# Patient Record
Sex: Female | Born: 1970 | Race: White | Hispanic: Yes | Marital: Married | Smoking: Never smoker
Health system: Southern US, Community
[De-identification: ages and names within clinical notes are randomized; demographics above are authoritative.]

## PROBLEM LIST (undated history)

## (undated) DIAGNOSIS — N760 Acute vaginitis: Secondary | ICD-10-CM

## (undated) DIAGNOSIS — B9689 Other specified bacterial agents as the cause of diseases classified elsewhere: Secondary | ICD-10-CM

## (undated) DIAGNOSIS — E079 Disorder of thyroid, unspecified: Secondary | ICD-10-CM

---

## 2006-02-17 ENCOUNTER — Ambulatory Visit (HOSPITAL_COMMUNITY): Admission: RE | Admit: 2006-02-17 | Discharge: 2006-02-17 | Payer: Self-pay | Admitting: Family Medicine

## 2009-02-20 ENCOUNTER — Ambulatory Visit (HOSPITAL_COMMUNITY): Admission: RE | Admit: 2009-02-20 | Discharge: 2009-02-20 | Payer: Self-pay | Admitting: Family Medicine

## 2009-02-26 ENCOUNTER — Encounter: Admission: RE | Admit: 2009-02-26 | Discharge: 2009-02-26 | Payer: Self-pay | Admitting: Family Medicine

## 2009-10-16 ENCOUNTER — Ambulatory Visit: Payer: Self-pay | Admitting: Obstetrics and Gynecology

## 2009-10-16 ENCOUNTER — Other Ambulatory Visit: Admission: RE | Admit: 2009-10-16 | Discharge: 2009-10-16 | Payer: Self-pay | Admitting: Obstetrics and Gynecology

## 2009-10-30 ENCOUNTER — Ambulatory Visit: Payer: Self-pay | Admitting: Obstetrics and Gynecology

## 2009-11-21 ENCOUNTER — Other Ambulatory Visit: Admission: RE | Admit: 2009-11-21 | Discharge: 2009-11-21 | Payer: Self-pay | Admitting: Obstetrics and Gynecology

## 2009-11-21 ENCOUNTER — Ambulatory Visit: Payer: Self-pay | Admitting: Obstetrics & Gynecology

## 2009-12-18 ENCOUNTER — Ambulatory Visit: Payer: Self-pay | Admitting: Obstetrics and Gynecology

## 2009-12-19 ENCOUNTER — Ambulatory Visit (HOSPITAL_COMMUNITY): Admission: RE | Admit: 2009-12-19 | Discharge: 2009-12-19 | Payer: Self-pay | Admitting: Obstetrics & Gynecology

## 2010-04-25 ENCOUNTER — Ambulatory Visit: Payer: Self-pay | Admitting: Obstetrics and Gynecology

## 2010-05-29 ENCOUNTER — Ambulatory Visit (INDEPENDENT_AMBULATORY_CARE_PROVIDER_SITE_OTHER): Payer: Medicaid Other | Admitting: Obstetrics and Gynecology

## 2010-05-29 ENCOUNTER — Other Ambulatory Visit: Payer: Self-pay | Admitting: Obstetrics and Gynecology

## 2010-05-29 ENCOUNTER — Other Ambulatory Visit: Payer: Self-pay | Admitting: Family Medicine

## 2010-05-29 DIAGNOSIS — D069 Carcinoma in situ of cervix, unspecified: Secondary | ICD-10-CM

## 2010-05-29 DIAGNOSIS — Z1231 Encounter for screening mammogram for malignant neoplasm of breast: Secondary | ICD-10-CM

## 2010-05-29 LAB — POCT PREGNANCY, URINE: Preg Test, Ur: NEGATIVE

## 2010-05-30 LAB — POCT PREGNANCY, URINE: Preg Test, Ur: NEGATIVE

## 2010-06-06 NOTE — Progress Notes (Unsigned)
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  That was done in September 2011, came today for repeat Pap smear.  Two weeks after her procedure last time, she flew to [place] on vacation and started bleeding very heavily, went into emergency room, they kept her overnight and had to use cautery in order to control the bleeding.  On examination today, the cervix appears completely well-healed and normal, looks almost like a clean nulliparous cervix and Pap smear was taken.  I have told her that with the findings that she had there if the Pap smears turn out to be okay, I would like to see her at least once every 6 months for the next 2 years before we go back to annuals.  I told her that she get a letter by the month or before and call if there is an abnormal Pap smear.  Impression is severe cervical dysplasia, CIN III with endocervical involvement and margins up to the endocervix.          ______________________________ Argentina Donovan, MD    PR/MEDQ  D:  05/29/2010  T:  05/30/2010  Job:  [id number]

## 2010-06-11 ENCOUNTER — Ambulatory Visit (HOSPITAL_COMMUNITY)
Admission: RE | Admit: 2010-06-11 | Discharge: 2010-06-11 | Disposition: A | Discharge status: Home / Self Care | Payer: Medicaid Other | Source: Ambulatory Visit | Attending: Family Medicine | Admitting: Family Medicine

## 2010-06-11 DIAGNOSIS — Z1231 Encounter for screening mammogram for malignant neoplasm of breast: Secondary | ICD-10-CM | POA: Insufficient documentation

## 2010-10-13 IMAGING — US US TRANSVAGINAL NON-OB
1 series · 13 of 25 positions shown · non-contrast
Comparison: None.

12/19/2009 - DUPLICATE COPY for exam association in [facility] – No change from original report.
CLINICAL DATA: Pelvic pain. Abnormal uterine bleeding. LMP
 11/30/2009



[Series 1: us transvaginal non-ob · 0.27mm/px · 13 of 52 slices shown]
[im 1/52]
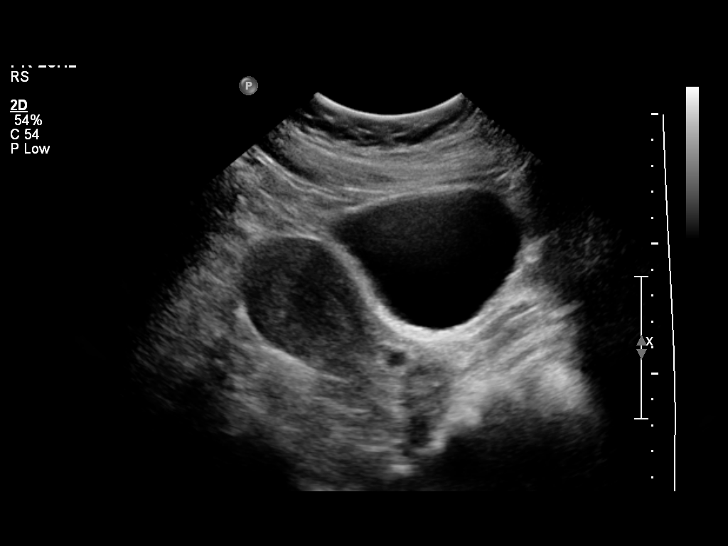
[im 5/52]
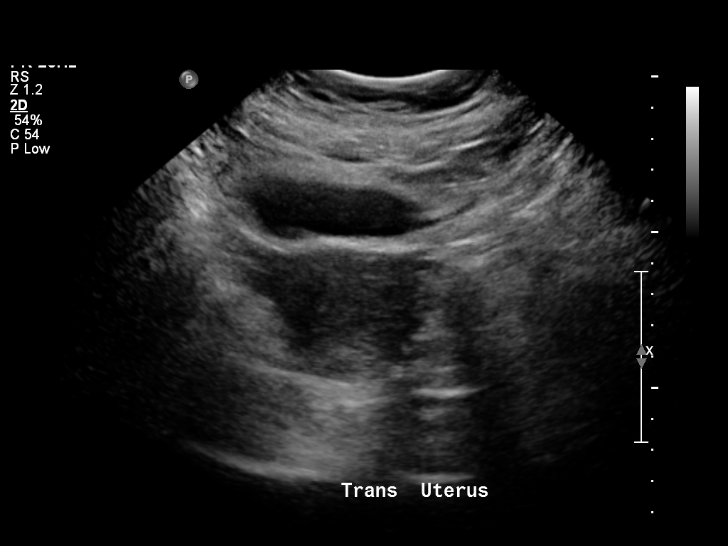
[im 9/52]
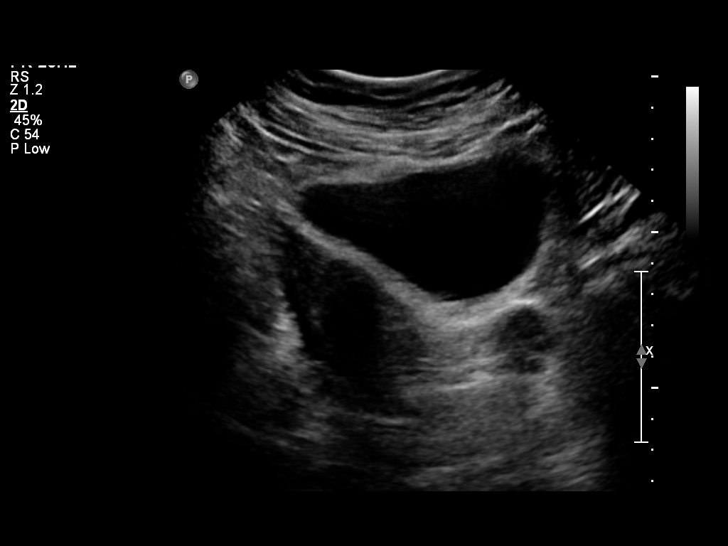
[im 13/52]
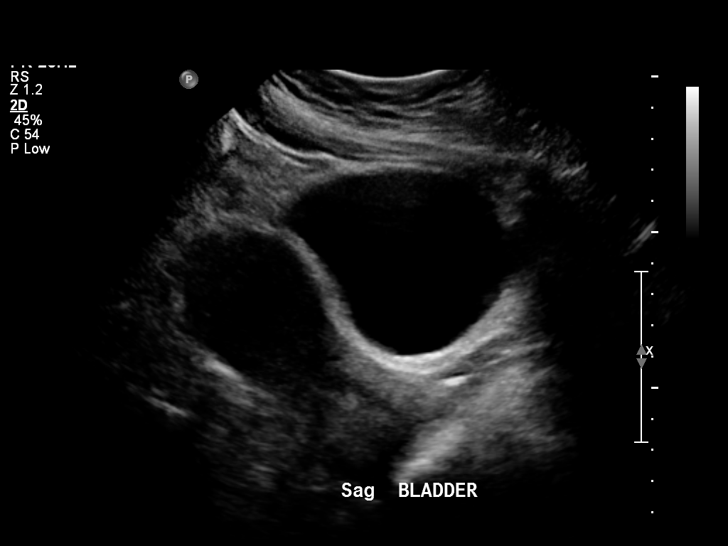
[im 18/52]
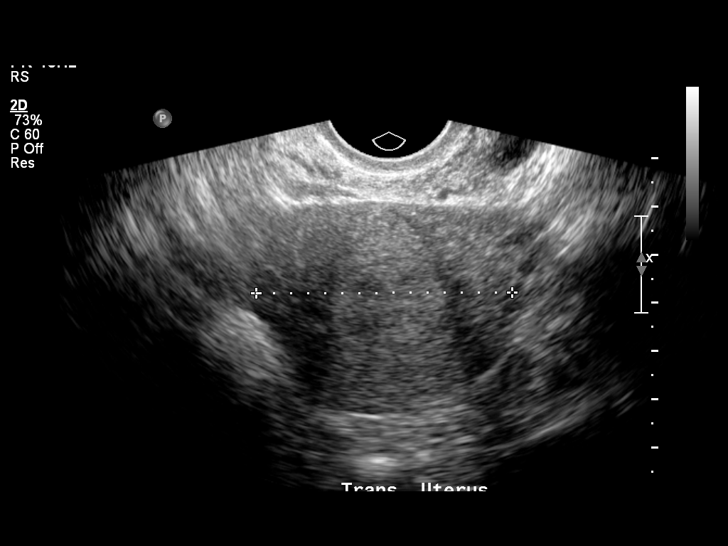
[im 22/52]
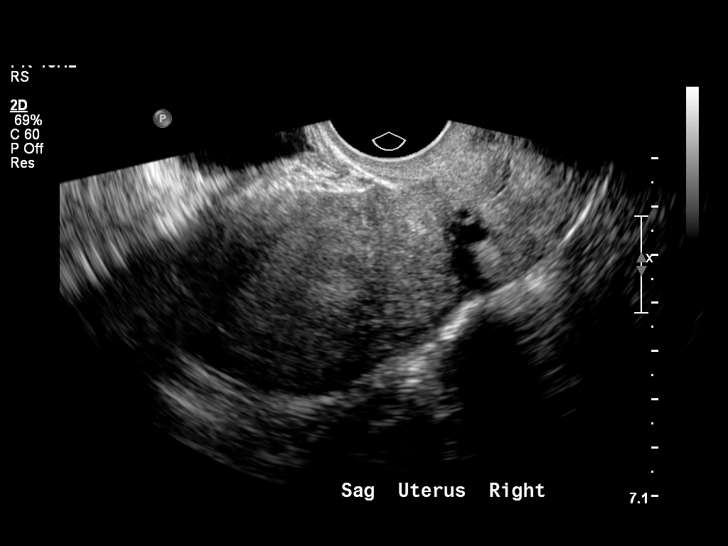
[im 26/52]
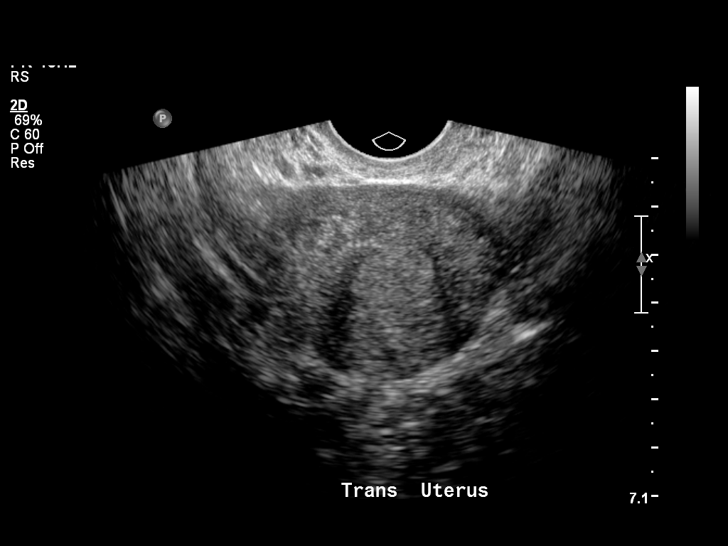
[im 30/52]
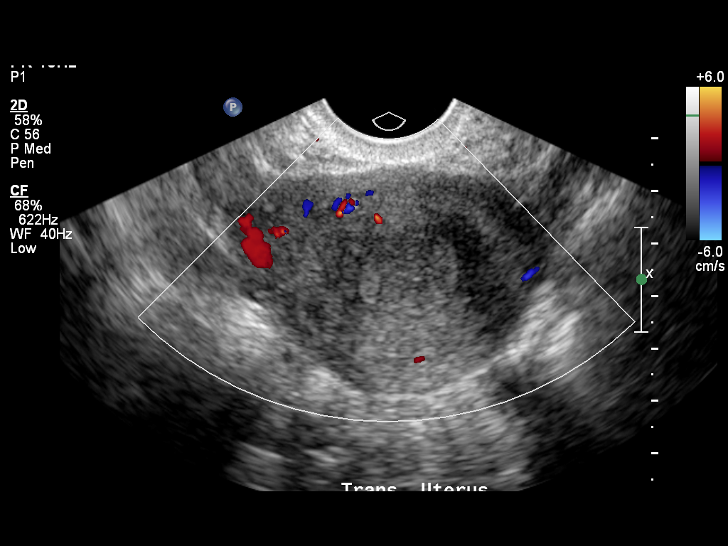
[im 35/52]
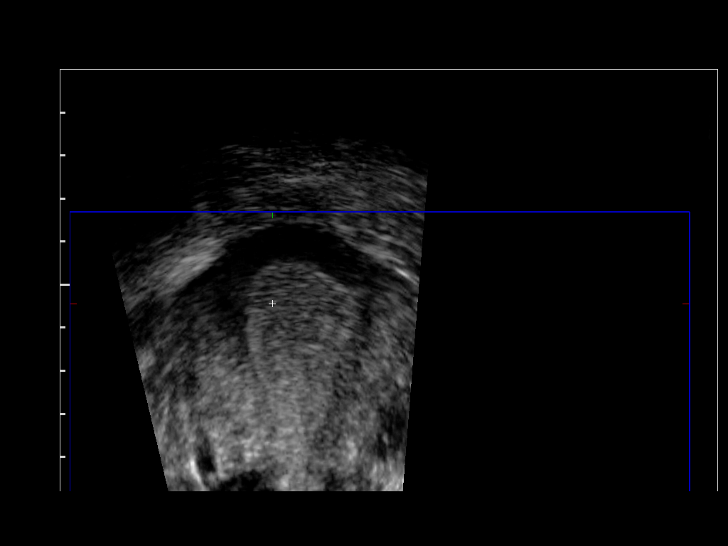
[im 39/52]
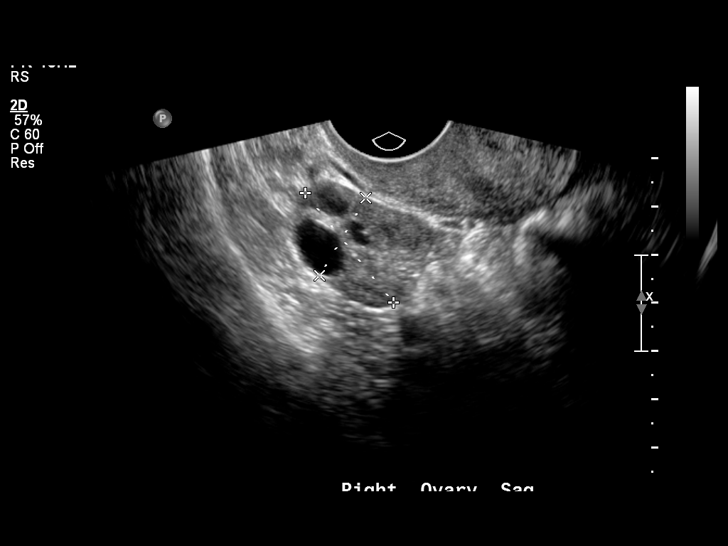
[im 43/52]
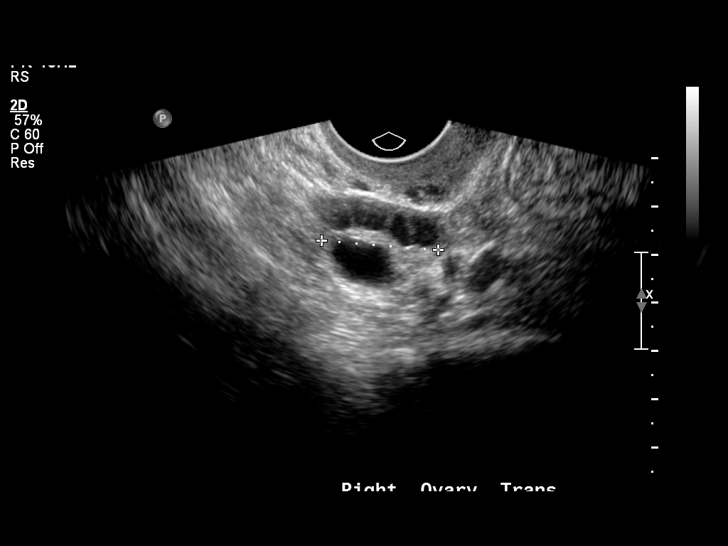
[im 47/52]
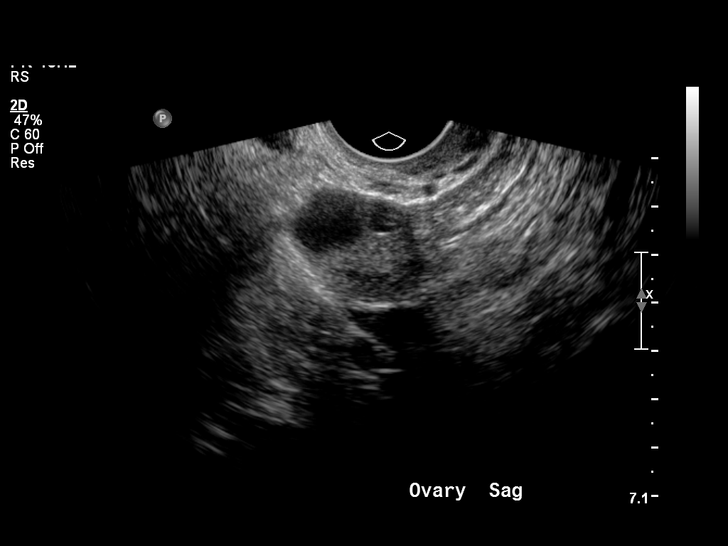
[im 52/52]
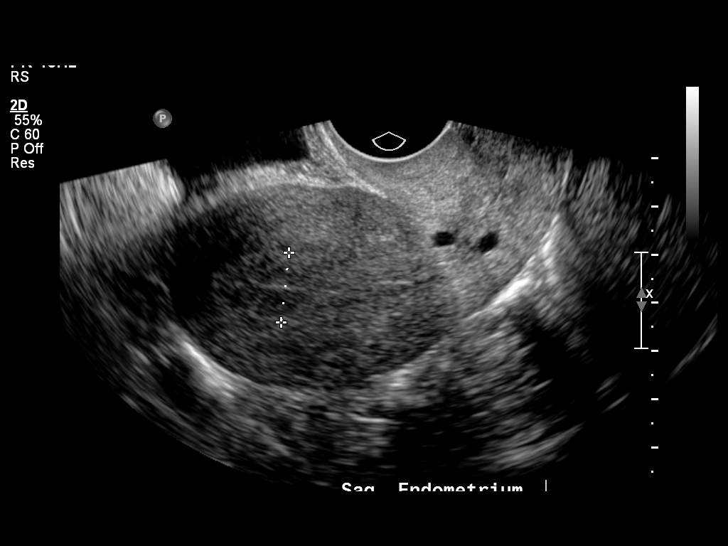

[13 of 25 positions shown; findings below may reference images not displayed]

FINDINGS: Uterus measures 8.8 x 4.2 x 5.3 cm. No fibroids or other uterine
 masses identified.

 Endometrium measures 14 mm in thickness. Within normal limits in
 appearance.

 Right Ovary measures 2.9 x 1.9 x 2.4 cm. Normal appearance.

 Left Ovary measures 2.8 x 1.9 x 2.3 cm. Normal appearance.

 Other Findings: No other abnormality identified.
IMPRESSION: Normal study. No evidence of pelvic mass or other significant
 abnormality.If abnormal uterine bleeding is unresponsive to
 hormonal or medical therapy, sonohysterogram could be performed for
 focal lesion work-up.

## 2011-04-05 IMAGING — MG MM DIGITAL SCREENING BILAT W/ CAD
4 series · 4 of 4 positions shown · non-contrast
Comparison: none

DG SCREEN MAMMOGRAM BILATERAL
Bilateral CC and MLO view(s) were taken.
Technologist: Dionisia Yt, RT, RM

DIGITAL SCREENING MAMMOGRAM WITH CAD:
The breast tissue is heterogeneously dense.  No masses or malignant type calcifications are 
identified.  Compared with prior studies.
Images were processed with CAD.

[R CC]
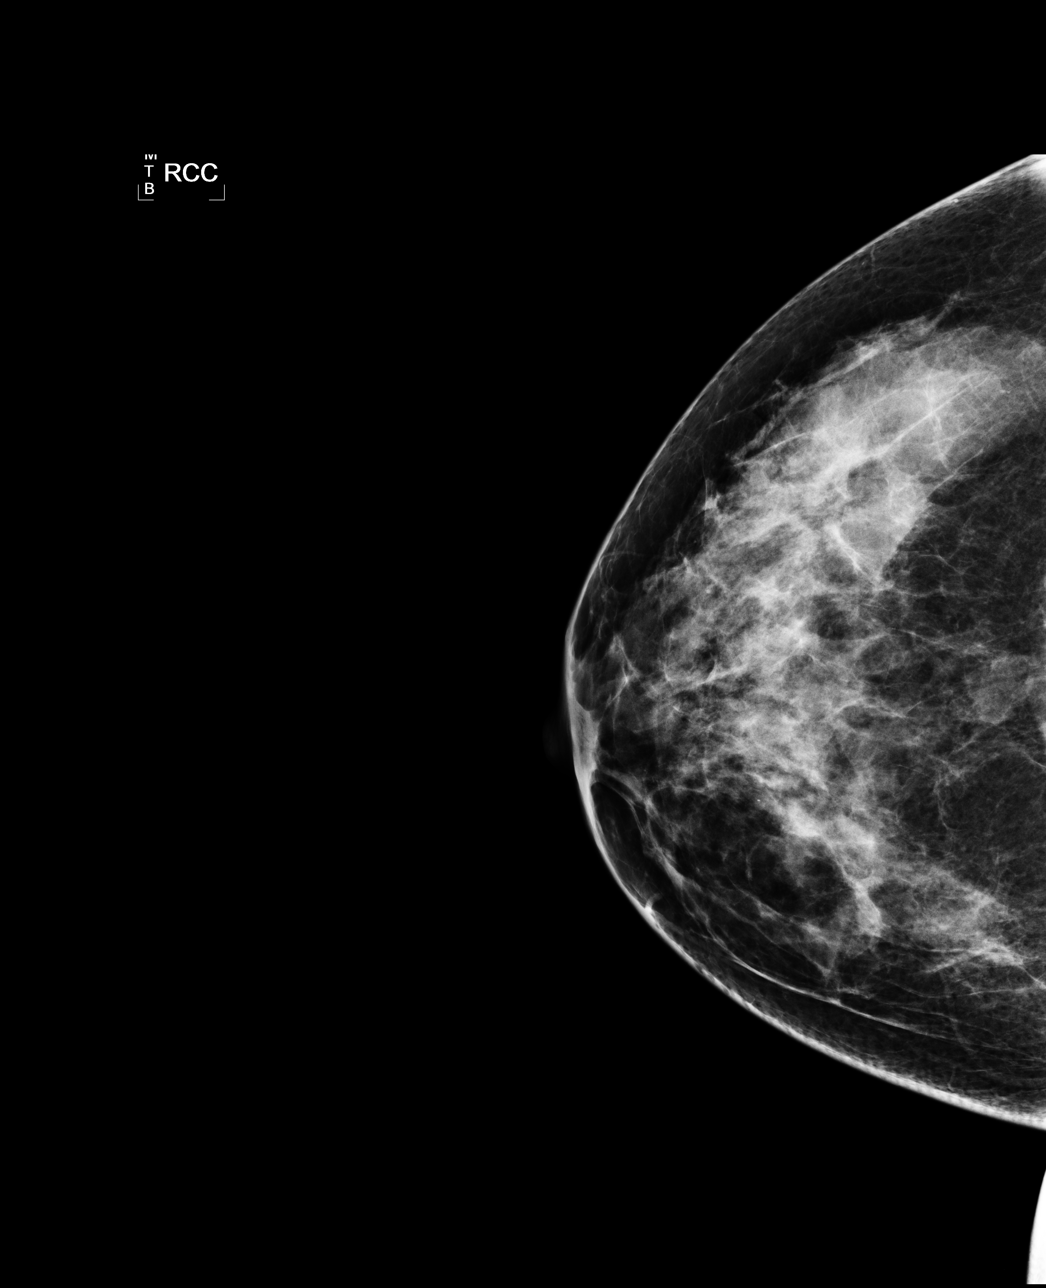

[R MLO]
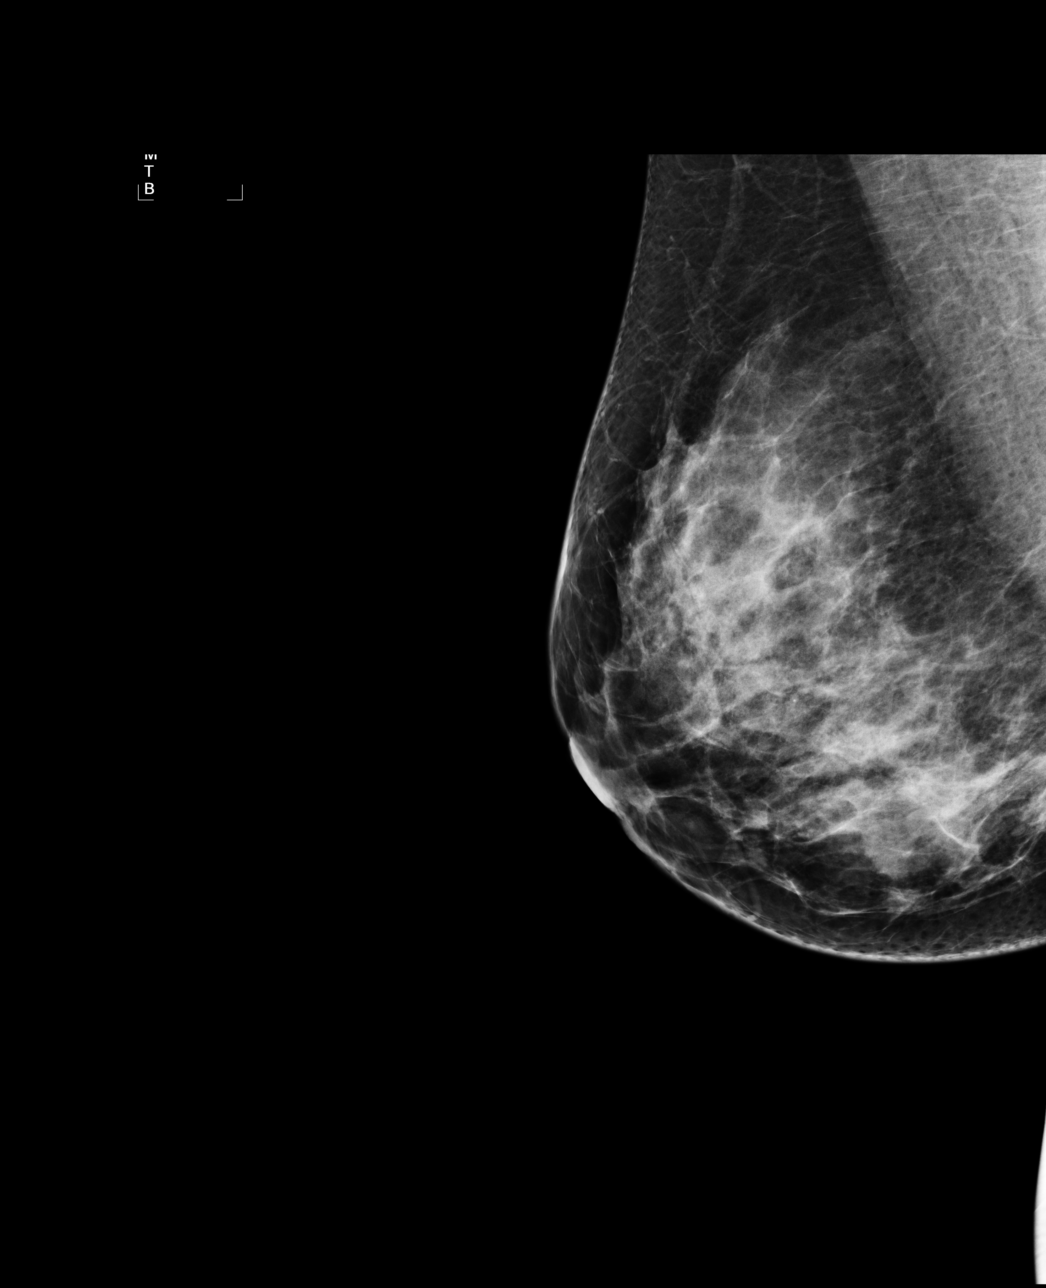

[L CC]
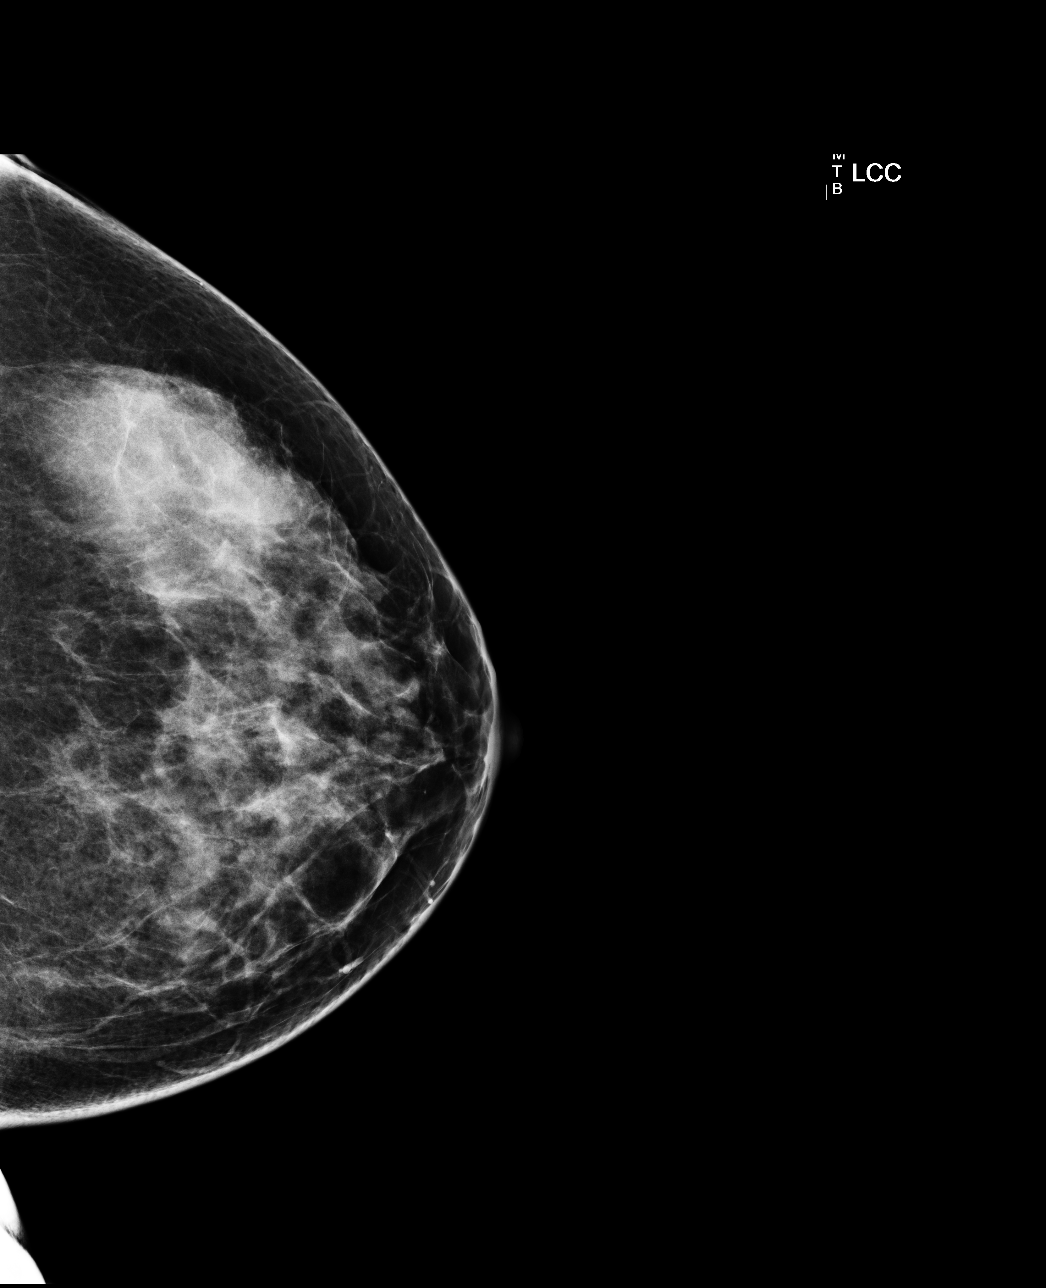

[L MLO]
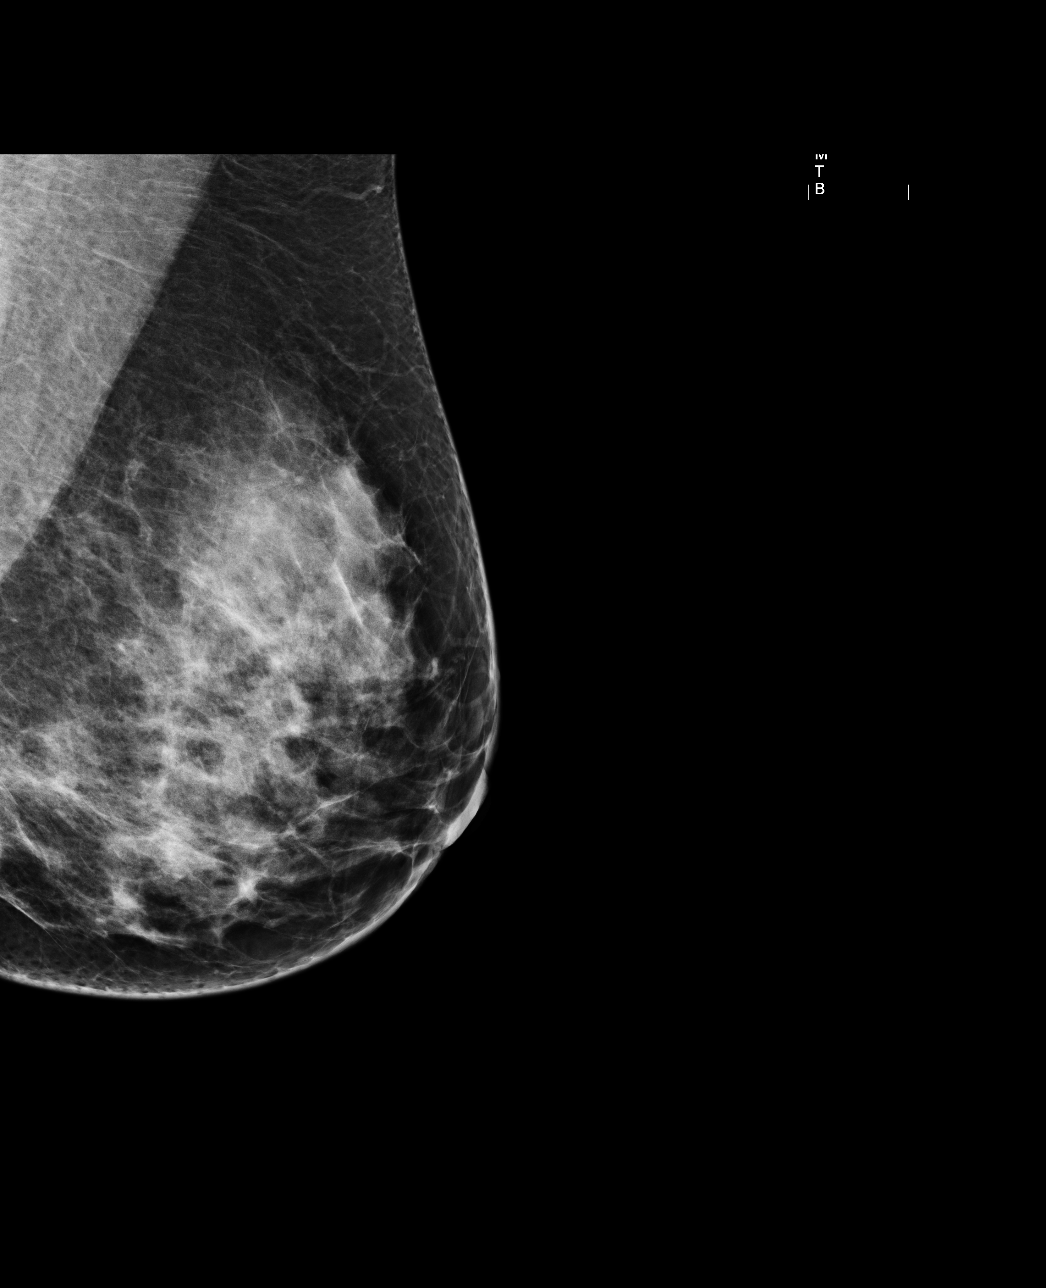

[4 of 4 positions shown; findings below may reference images not displayed]

IMPRESSION: No specific mammographic evidence of malignancy.  Next screening mammogram is recommended in one 
year.

A result letter of this screening mammogram will be mailed directly to the patient.

ASSESSMENT: Negative - BI-RADS 1

Screening mammogram in 1 year.
,

## 2013-03-30 ENCOUNTER — Other Ambulatory Visit: Payer: Self-pay | Admitting: Obstetrics and Gynecology

## 2013-03-30 DIAGNOSIS — Z1231 Encounter for screening mammogram for malignant neoplasm of breast: Secondary | ICD-10-CM

## 2013-05-01 ENCOUNTER — Telehealth (HOSPITAL_COMMUNITY): Payer: Self-pay | Admitting: *Deleted

## 2013-05-01 NOTE — Telephone Encounter (Signed)
Telephoned patient at home # and left message to return call to [facility] 

## 2013-05-02 ENCOUNTER — Ambulatory Visit (HOSPITAL_COMMUNITY): Payer: Medicaid Other

## 2013-05-09 ENCOUNTER — Ambulatory Visit (HOSPITAL_COMMUNITY): Payer: Medicaid Other | Attending: Obstetrics and Gynecology

## 2013-05-09 ENCOUNTER — Ambulatory Visit (HOSPITAL_COMMUNITY): Payer: Medicaid Other

## 2013-05-10 ENCOUNTER — Telehealth (HOSPITAL_COMMUNITY): Payer: Self-pay | Admitting: *Deleted

## 2013-05-10 NOTE — Telephone Encounter (Signed)
Telephoned patient at home # and left message to return call to [facility] 

## 2013-05-15 ENCOUNTER — Other Ambulatory Visit: Payer: Self-pay | Admitting: Obstetrics and Gynecology

## 2013-05-15 DIAGNOSIS — Z1231 Encounter for screening mammogram for malignant neoplasm of breast: Secondary | ICD-10-CM

## 2013-06-06 ENCOUNTER — Ambulatory Visit (HOSPITAL_COMMUNITY): Payer: Medicaid Other | Attending: Obstetrics and Gynecology

## 2013-06-06 ENCOUNTER — Inpatient Hospital Stay (HOSPITAL_COMMUNITY): Admission: RE | Admit: 2013-06-06 | Payer: Medicaid Other | Source: Ambulatory Visit

## 2013-09-14 ENCOUNTER — Encounter (HOSPITAL_BASED_OUTPATIENT_CLINIC_OR_DEPARTMENT_OTHER): Payer: Self-pay | Admitting: Emergency Medicine

## 2013-09-14 ENCOUNTER — Emergency Department (HOSPITAL_BASED_OUTPATIENT_CLINIC_OR_DEPARTMENT_OTHER)
Admission: EM | Admit: 2013-09-14 | Discharge: 2013-09-14 | Disposition: A | Discharge status: Home / Self Care | Payer: Medicaid Other | Attending: Emergency Medicine | Admitting: Emergency Medicine

## 2013-09-14 DIAGNOSIS — A499 Bacterial infection, unspecified: Secondary | ICD-10-CM | POA: Insufficient documentation

## 2013-09-14 DIAGNOSIS — Z79899 Other long term (current) drug therapy: Secondary | ICD-10-CM | POA: Insufficient documentation

## 2013-09-14 DIAGNOSIS — B9689 Other specified bacterial agents as the cause of diseases classified elsewhere: Secondary | ICD-10-CM

## 2013-09-14 DIAGNOSIS — N760 Acute vaginitis: Secondary | ICD-10-CM | POA: Insufficient documentation

## 2013-09-14 DIAGNOSIS — Z3202 Encounter for pregnancy test, result negative: Secondary | ICD-10-CM | POA: Insufficient documentation

## 2013-09-14 DIAGNOSIS — A6000 Herpesviral infection of urogenital system, unspecified: Secondary | ICD-10-CM | POA: Insufficient documentation

## 2013-09-14 DIAGNOSIS — E079 Disorder of thyroid, unspecified: Secondary | ICD-10-CM | POA: Insufficient documentation

## 2013-09-14 HISTORY — DX: Other specified bacterial agents as the cause of diseases classified elsewhere: N76.0

## 2013-09-14 HISTORY — DX: Other specified bacterial agents as the cause of diseases classified elsewhere: B96.89

## 2013-09-14 HISTORY — DX: Disorder of thyroid, unspecified: E07.9

## 2013-09-14 LAB — URINALYSIS, ROUTINE W REFLEX MICROSCOPIC
Bilirubin Urine: NEGATIVE
GLUCOSE, UA: NEGATIVE mg/dL
HGB URINE DIPSTICK: NEGATIVE
Ketones, ur: NEGATIVE mg/dL
LEUKOCYTES UA: NEGATIVE
Nitrite: NEGATIVE
PH: 5 (ref 5.0–8.0)
Protein, ur: NEGATIVE mg/dL
Specific Gravity, Urine: 1.029 (ref 1.005–1.030)
Urobilinogen, UA: 0.2 mg/dL (ref 0.0–1.0)

## 2013-09-14 LAB — WET PREP, GENITAL
TRICH WET PREP: NONE SEEN
YEAST WET PREP: NONE SEEN

## 2013-09-14 LAB — PREGNANCY, URINE: PREG TEST UR: NEGATIVE

## 2013-09-14 MED ORDER — VALACYCLOVIR HCL 1 G PO TABS: 1000.0000 mg | ORAL_TABLET | Freq: Three times a day (TID) | ORAL | Status: AC

## 2013-09-14 MED ORDER — METRONIDAZOLE 500 MG PO TABS: 500.0000 mg | ORAL_TABLET | Freq: Two times a day (BID) | ORAL | Status: AC

## 2013-09-14 NOTE — ED Notes (Signed)
Pt admits to white vaginal discharge w/ foul odor x1 week - pt called her PCP however was not going to be able to get seen until after Tuesday. Pt admits to hx of bacterial vaginosis.

## 2013-09-14 NOTE — ED Provider Notes (Signed)
CSN: [id number]     Arrival date & time 09/14/13  2019 History   First MD Initiated Contact with Patient 09/14/13 2030     Chief Complaint  Patient presents with  . Vaginal Discharge     (Consider location/radiation/quality/duration/timing/severity/associated sxs/prior Treatment) HPI Comments: Pt states that she has been having white vaginal discharge with four odor for the last week. States it is similar to previous bv episodes. Pt states that she is also having burning an pain to the the opening of her vaginal and she thinks she may have blisters. Denies std in the past. No dysuria, abdominal pain or fever.   No language interpreter was used.    Past Medical History  Diagnosis Date  . Bacterial vaginosis   . Thyroid disease    History reviewed. No pertinent past surgical history. History reviewed. No pertinent family history. History  Substance Use Topics  . Smoking status: Never Smoker   . Smokeless tobacco: Not on file  . Alcohol Use: No   OB History   Grav Para Term Preterm Abortions TAB SAB Ect Mult Living                 Review of Systems  Constitutional: Negative.   Respiratory: Negative.   Cardiovascular: Negative.       Allergies  Review of patient's allergies indicates no known allergies.  Home Medications   Prior to Admission medications   Medication Sig Start Date End Date Taking? Authorizing Provider  levothyroxine (SYNTHROID, LEVOTHROID) 125 MCG tablet Take 125 mcg by mouth daily before breakfast.   Yes Historical Provider, MD  metroNIDAZOLE (FLAGYL) 500 MG tablet Take 1 tablet (500 mg total) by mouth 2 (two) times daily. 09/14/13   Teressa LowerVrinda Alyce Inscore, NP  valACYclovir (VALTREX) 1000 MG tablet Take 1 tablet (1,000 mg total) by mouth 3 (three) times daily. 09/14/13   Teressa LowerVrinda Nafeesa Dils, NP   BP 149/95  Pulse 96  Temp(Src) 97.8 F (36.6 C) (Oral)  Resp 18  Ht 5\' 7"  (1.702 m)  Wt 160 lb (72.576 kg)  BMI 25.05 kg/m2  SpO2 100%  LMP 09/03/2013 Physical  Exam  Nursing note and vitals reviewed. Constitutional: She is oriented to person, place, and time. She appears well-developed and well-nourished.  Cardiovascular: Normal rate and regular rhythm.   Pulmonary/Chest: Effort normal and breath sounds normal.  Genitourinary:     White vaginal discharge. -cmt. Indurated sore to labia  Neurological: She is oriented to person, place, and time.  Skin: Skin is warm and dry.  Psychiatric: She has a normal mood and affect.    ED Course  Procedures (including critical care time) Labs Review Labs Reviewed  WET PREP, GENITAL - Abnormal; Notable for the following:    Clue Cells Wet Prep HPF POC FEW (*)    WBC, Wet Prep HPF POC FEW (*)    All other components within normal limits  URINALYSIS, ROUTINE W REFLEX MICROSCOPIC - Abnormal; Notable for the following:    APPearance CLOUDY (*)    All other components within normal limits  GC/CHLAMYDIA PROBE AMP  VIRAL CULTURE VIRC  PREGNANCY, URINE    Imaging Review No results found.   EKG Interpretation None      MDM   Final diagnoses:  BV (bacterial vaginosis)  Herpes genitalia    Sores consistent with herpes. Cultures sent. Will treat for bv and herpes.pt has follow up with pcp    Teressa LowerVrinda Kemara Quigley, NP 09/14/13 2157

## 2013-09-14 NOTE — Discharge Instructions (Signed)
Bacterial Vaginosis Bacterial vaginosis is an infection of the vagina. It happens when too many of certain germs (bacteria) grow in the vagina. HOME CARE  Take your medicine as told by your doctor.  Finish your medicine even if you start to feel better.  Do not have sex until you finish your medicine and are better.  Tell your sex partner that you have an infection. They should see their doctor for treatment.  Practice safe sex. Use condoms. Have only one sex partner. GET HELP IF:  You are not getting better after 3 days of treatment.  You have more grey fluid (discharge) coming from your vagina than before.  You have more pain than before.  You have a fever. MAKE SURE YOU:   Understand these instructions.  Will watch your condition.  Will get help right away if you are not doing well or get worse. Document Released: 12/10/2007 Document Revised: 12/21/2012 Document Reviewed: 10/12/2012 [facility] Patient Information 2015 [place], [place]. This information is not intended to replace advice given to you by your health care provider. Make sure you discuss any questions you have with your health care provider.  Genital Herpes Genital herpes is a sexually transmitted disease. This means that it is a disease passed by having sex with an infected person. There is no cure for genital herpes. The time between attacks can be months to years. The virus may live in a person but produce no problems (symptoms). This infection can be passed to a baby as it travels down the birth canal (vagina). In a newborn, this can cause central nervous system damage, eye damage, or even death. The virus that causes genital herpes is usually HSV-2 virus. The virus that causes oral herpes is usually HSV-1. The diagnosis (learning what is wrong) is made through culture results. SYMPTOMS  Usually symptoms of pain and itching begin a few days to a week after contact. It first appears as small blisters that progress to  small painful ulcers which then scab over and heal after several days. It affects the outer genitalia, birth canal, cervix, penis, anal area, buttocks, and thighs. HOME CARE INSTRUCTIONS   Keep ulcerated areas dry and clean.  Take medications as directed. Antiviral medications can speed up healing. They will not prevent recurrences or cure this infection. These medications can also be taken for suppression if there are frequent recurrences.  While the infection is active, it is contagious. Avoid all sexual contact during active infections.  Condoms may help prevent spread of the herpes virus.  Practice safe sex.  Wash your hands thoroughly after touching the genital area.  Avoid touching your eyes after touching your genital area.  Inform your caregiver if you have had genital herpes and become pregnant. It is your responsibility to insure a safe outcome for your baby in this pregnancy.  Only take over-the-counter or prescription medicines for pain, discomfort, or fever as directed by your caregiver. SEEK MEDICAL CARE IF:   You have a recurrence of this infection.  You do not respond to medications and are not improving.  You have new sources of pain or discharge which have changed from the original infection.  You have an oral temperature above 102 F (38.9 C).  You develop abdominal pain.  You develop eye pain or signs of eye infection. Document Released: 02/28/2000 Document Revised: 05/25/2011 Document Reviewed: 03/20/2009 [facility]otoExitCare Patient Information 2015 [place], [place]. This information is not intended to replace advice given to you by your health care provider.  Make sure you discuss any questions you have with your health care provider. ° °

## 2013-09-14 NOTE — ED Notes (Signed)
Pt d/c'd by Dwana MelenaSue Booth, RN.

## 2013-09-15 NOTE — ED Provider Notes (Signed)
Medical screening examination/treatment/procedure(s) were performed by non-physician practitioner and as supervising physician I was immediately available for consultation/collaboration.   EKG Interpretation None        Candyce ChurnJohn David Iyani Dresner III, MD 09/15/13 [phone]

## 2013-09-16 LAB — GC/CHLAMYDIA PROBE AMP
CT Probe RNA: NEGATIVE
GC PROBE AMP APTIMA: NEGATIVE

## 2013-09-25 LAB — VIRAL CULTURE VIRC: Special Requests: NORMAL

## 2014-01-09 ENCOUNTER — Ambulatory Visit (HOSPITAL_COMMUNITY): Payer: Medicaid Other

## 2014-02-05 ENCOUNTER — Other Ambulatory Visit: Payer: Self-pay

## 2014-02-05 DIAGNOSIS — Z1231 Encounter for screening mammogram for malignant neoplasm of breast: Secondary | ICD-10-CM
